# Patient Record
Sex: Male | Born: 1945 | Hispanic: No | Marital: Single | Smoking: Former smoker
Health system: Southern US, Community
[De-identification: ages and names within clinical notes are randomized; demographics above are authoritative.]

## PROBLEM LIST (undated history)

## (undated) DIAGNOSIS — E559 Vitamin D deficiency, unspecified: Secondary | ICD-10-CM

## (undated) DIAGNOSIS — R195 Other fecal abnormalities: Secondary | ICD-10-CM

## (undated) DIAGNOSIS — I10 Essential (primary) hypertension: Secondary | ICD-10-CM

## (undated) DIAGNOSIS — E119 Type 2 diabetes mellitus without complications: Secondary | ICD-10-CM

## (undated) HISTORY — PX: ANKLE SURGERY: SHX546

## (undated) HISTORY — DX: Type 2 diabetes mellitus without complications: E11.9

## (undated) HISTORY — DX: Essential (primary) hypertension: I10

## (undated) HISTORY — DX: Other fecal abnormalities: R19.5

## (undated) HISTORY — DX: Vitamin D deficiency, unspecified: E55.9

---

## 2006-09-29 ENCOUNTER — Ambulatory Visit (HOSPITAL_COMMUNITY): Admission: RE | Admit: 2006-09-29 | Discharge: 2006-09-29 | Payer: Self-pay | Admitting: Family Medicine

## 2013-08-04 DIAGNOSIS — IMO0001 Reserved for inherently not codable concepts without codable children: Secondary | ICD-10-CM | POA: Diagnosis not present

## 2013-09-02 DIAGNOSIS — IMO0001 Reserved for inherently not codable concepts without codable children: Secondary | ICD-10-CM | POA: Diagnosis not present

## 2013-11-09 DIAGNOSIS — IMO0001 Reserved for inherently not codable concepts without codable children: Secondary | ICD-10-CM | POA: Diagnosis not present

## 2013-12-15 DIAGNOSIS — E119 Type 2 diabetes mellitus without complications: Secondary | ICD-10-CM | POA: Diagnosis not present

## 2013-12-15 DIAGNOSIS — Z0000 Encounter for general adult medical examination without abnormal findings: Secondary | ICD-10-CM | POA: Diagnosis not present

## 2013-12-15 DIAGNOSIS — Z23 Encounter for immunization: Secondary | ICD-10-CM | POA: Diagnosis not present

## 2013-12-15 DIAGNOSIS — I10 Essential (primary) hypertension: Secondary | ICD-10-CM | POA: Diagnosis not present

## 2013-12-15 DIAGNOSIS — Z125 Encounter for screening for malignant neoplasm of prostate: Secondary | ICD-10-CM | POA: Diagnosis not present

## 2013-12-15 DIAGNOSIS — Z1211 Encounter for screening for malignant neoplasm of colon: Secondary | ICD-10-CM | POA: Diagnosis not present

## 2014-03-17 DIAGNOSIS — Z23 Encounter for immunization: Secondary | ICD-10-CM | POA: Diagnosis not present

## 2014-03-17 DIAGNOSIS — IMO0001 Reserved for inherently not codable concepts without codable children: Secondary | ICD-10-CM | POA: Diagnosis not present

## 2014-06-01 DIAGNOSIS — Z1211 Encounter for screening for malignant neoplasm of colon: Secondary | ICD-10-CM | POA: Diagnosis not present

## 2014-08-08 DIAGNOSIS — E1165 Type 2 diabetes mellitus with hyperglycemia: Secondary | ICD-10-CM | POA: Diagnosis not present

## 2014-10-19 DIAGNOSIS — E1165 Type 2 diabetes mellitus with hyperglycemia: Secondary | ICD-10-CM | POA: Diagnosis not present

## 2014-10-19 DIAGNOSIS — I10 Essential (primary) hypertension: Secondary | ICD-10-CM | POA: Diagnosis not present

## 2014-12-21 DIAGNOSIS — Z1389 Encounter for screening for other disorder: Secondary | ICD-10-CM | POA: Diagnosis not present

## 2014-12-21 DIAGNOSIS — I10 Essential (primary) hypertension: Secondary | ICD-10-CM | POA: Diagnosis not present

## 2014-12-21 DIAGNOSIS — E1165 Type 2 diabetes mellitus with hyperglycemia: Secondary | ICD-10-CM | POA: Diagnosis not present

## 2015-02-07 DIAGNOSIS — E119 Type 2 diabetes mellitus without complications: Secondary | ICD-10-CM | POA: Diagnosis not present

## 2015-02-07 DIAGNOSIS — I10 Essential (primary) hypertension: Secondary | ICD-10-CM | POA: Diagnosis not present

## 2015-03-20 DIAGNOSIS — Z23 Encounter for immunization: Secondary | ICD-10-CM | POA: Diagnosis not present

## 2015-03-20 DIAGNOSIS — E1165 Type 2 diabetes mellitus with hyperglycemia: Secondary | ICD-10-CM | POA: Diagnosis not present

## 2015-03-20 DIAGNOSIS — Z0000 Encounter for general adult medical examination without abnormal findings: Secondary | ICD-10-CM | POA: Diagnosis not present

## 2015-03-20 DIAGNOSIS — Z136 Encounter for screening for cardiovascular disorders: Secondary | ICD-10-CM | POA: Diagnosis not present

## 2015-03-20 DIAGNOSIS — I10 Essential (primary) hypertension: Secondary | ICD-10-CM | POA: Diagnosis not present

## 2015-03-20 DIAGNOSIS — Z125 Encounter for screening for malignant neoplasm of prostate: Secondary | ICD-10-CM | POA: Diagnosis not present

## 2015-03-20 DIAGNOSIS — Z1389 Encounter for screening for other disorder: Secondary | ICD-10-CM | POA: Diagnosis not present

## 2015-03-21 ENCOUNTER — Other Ambulatory Visit: Payer: Self-pay | Admitting: Family Medicine

## 2015-03-21 DIAGNOSIS — Z136 Encounter for screening for cardiovascular disorders: Secondary | ICD-10-CM

## 2015-04-13 ENCOUNTER — Ambulatory Visit
Admission: RE | Admit: 2015-04-13 | Discharge: 2015-04-13 | Disposition: A | Discharge status: Home / Self Care | Payer: Medicare Other | Source: Ambulatory Visit | Attending: Family Medicine | Admitting: Family Medicine

## 2015-04-13 DIAGNOSIS — Z136 Encounter for screening for cardiovascular disorders: Secondary | ICD-10-CM

## 2015-05-16 DIAGNOSIS — E119 Type 2 diabetes mellitus without complications: Secondary | ICD-10-CM | POA: Diagnosis not present

## 2015-06-27 DIAGNOSIS — E119 Type 2 diabetes mellitus without complications: Secondary | ICD-10-CM | POA: Diagnosis not present

## 2015-06-27 DIAGNOSIS — I10 Essential (primary) hypertension: Secondary | ICD-10-CM | POA: Diagnosis not present

## 2015-09-20 DIAGNOSIS — I10 Essential (primary) hypertension: Secondary | ICD-10-CM | POA: Diagnosis not present

## 2015-09-20 DIAGNOSIS — Z794 Long term (current) use of insulin: Secondary | ICD-10-CM | POA: Diagnosis not present

## 2015-09-20 DIAGNOSIS — E1165 Type 2 diabetes mellitus with hyperglycemia: Secondary | ICD-10-CM | POA: Diagnosis not present

## 2015-09-20 DIAGNOSIS — Z7984 Long term (current) use of oral hypoglycemic drugs: Secondary | ICD-10-CM | POA: Diagnosis not present

## 2015-10-17 DIAGNOSIS — E119 Type 2 diabetes mellitus without complications: Secondary | ICD-10-CM | POA: Diagnosis not present

## 2015-10-17 DIAGNOSIS — I10 Essential (primary) hypertension: Secondary | ICD-10-CM | POA: Diagnosis not present

## 2016-01-01 DIAGNOSIS — E119 Type 2 diabetes mellitus without complications: Secondary | ICD-10-CM | POA: Diagnosis not present

## 2016-01-01 DIAGNOSIS — Z1211 Encounter for screening for malignant neoplasm of colon: Secondary | ICD-10-CM | POA: Diagnosis not present

## 2016-01-16 DIAGNOSIS — E559 Vitamin D deficiency, unspecified: Secondary | ICD-10-CM | POA: Diagnosis not present

## 2016-01-16 DIAGNOSIS — I10 Essential (primary) hypertension: Secondary | ICD-10-CM | POA: Diagnosis not present

## 2016-01-16 DIAGNOSIS — E119 Type 2 diabetes mellitus without complications: Secondary | ICD-10-CM | POA: Diagnosis not present

## 2016-01-16 DIAGNOSIS — Z23 Encounter for immunization: Secondary | ICD-10-CM | POA: Diagnosis not present

## 2016-01-16 DIAGNOSIS — R195 Other fecal abnormalities: Secondary | ICD-10-CM | POA: Diagnosis not present

## 2016-04-19 DIAGNOSIS — I10 Essential (primary) hypertension: Secondary | ICD-10-CM | POA: Diagnosis not present

## 2016-04-19 DIAGNOSIS — L989 Disorder of the skin and subcutaneous tissue, unspecified: Secondary | ICD-10-CM | POA: Diagnosis not present

## 2016-04-19 DIAGNOSIS — E119 Type 2 diabetes mellitus without complications: Secondary | ICD-10-CM | POA: Diagnosis not present

## 2016-04-19 DIAGNOSIS — R195 Other fecal abnormalities: Secondary | ICD-10-CM | POA: Diagnosis not present

## 2016-04-19 DIAGNOSIS — Z23 Encounter for immunization: Secondary | ICD-10-CM | POA: Diagnosis not present

## 2016-08-24 IMAGING — US US AORTA SCREENING (MEDICARE)
1 series · 14 of 25 positions shown · non-contrast
Comparison: No priors.

CLINICAL DATA: Medicare screening exam for abdominal aortic
aneurysm.

EXAM:
ABDOMINAL AORTA SCREENING ULTRASOUND
TECHNIQUE: Ultrasound examination of the abdominal aorta was performed as a
screening evaluation for abdominal aortic aneurysm.

[Series 1: us aorta screening (medicare) · 0.23mm/px · 14 of 27 slices shown]
[im 1/27]
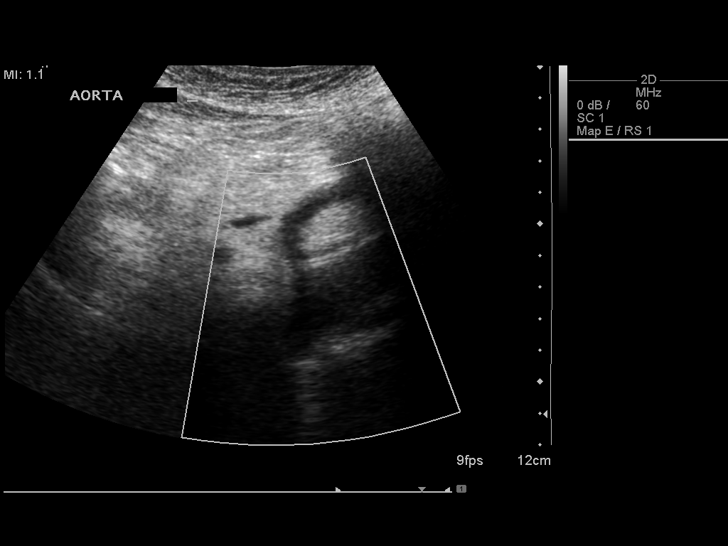
[im 3/27]
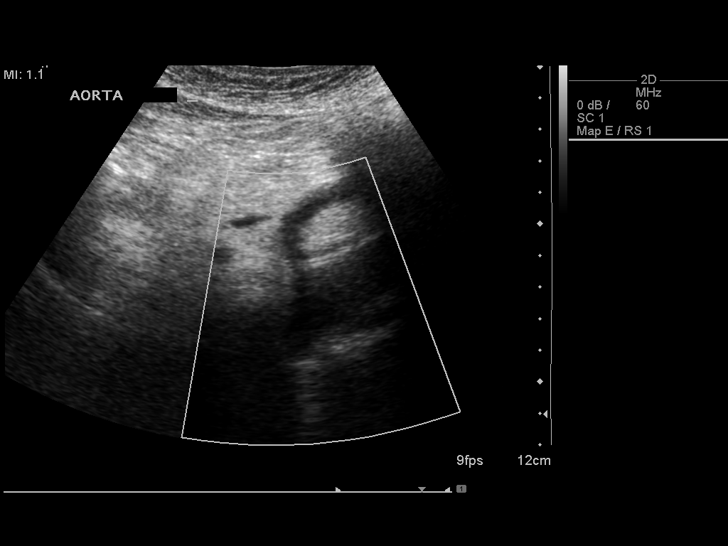
[im 5/27]
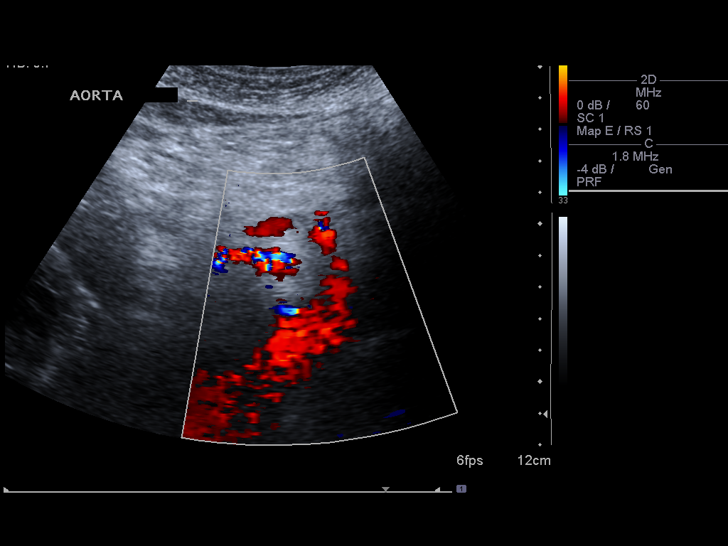
[im 7/27]
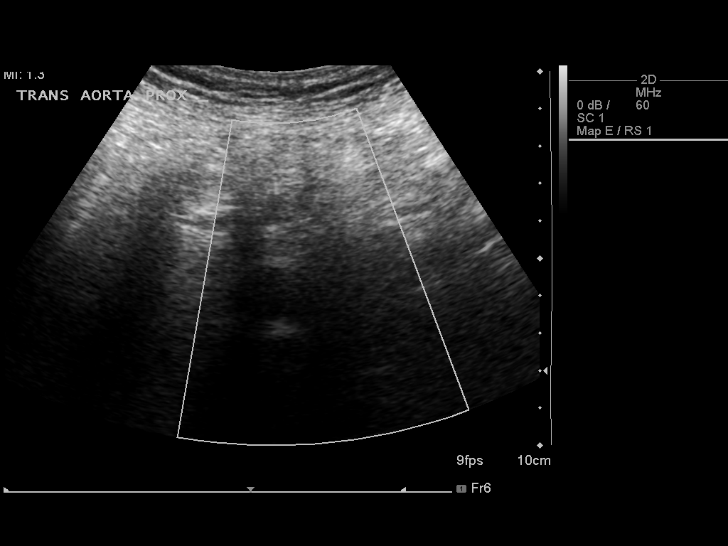
[im 9/27]
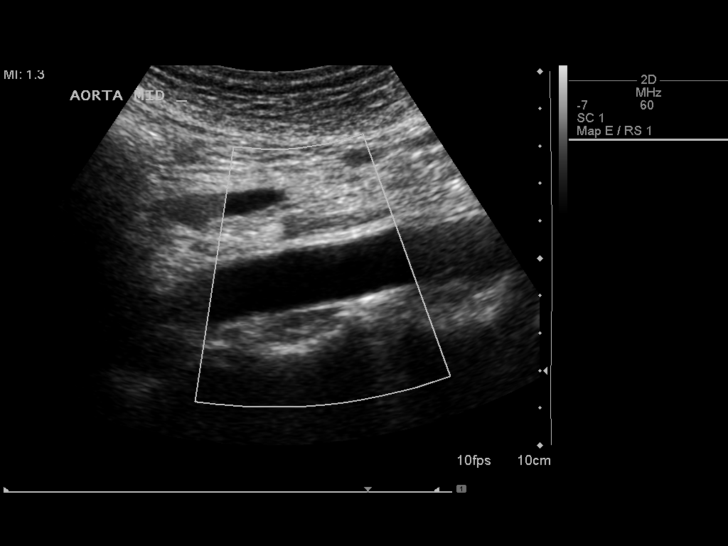
[im 10/27]
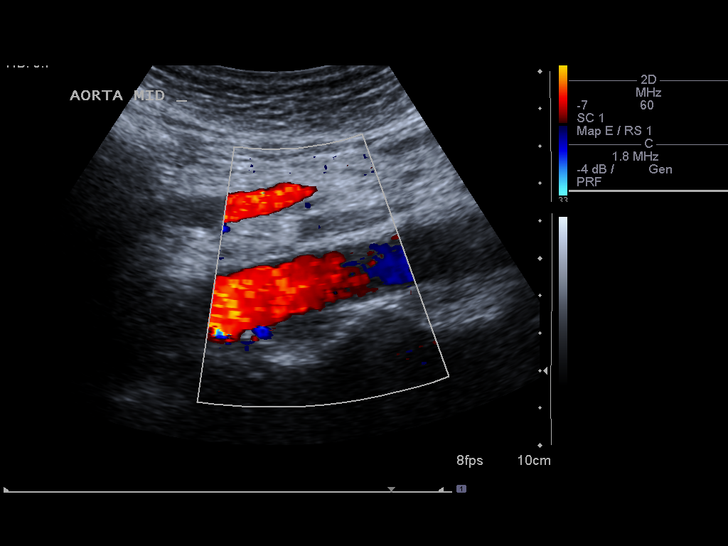
[im 12/27]
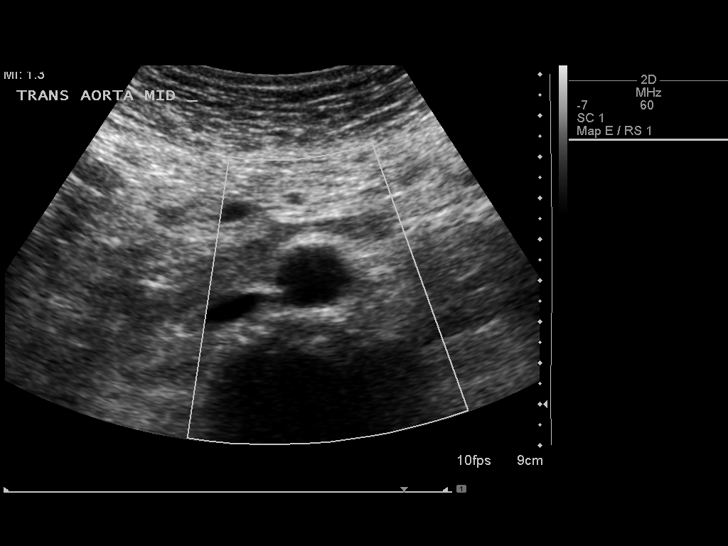
[im 15/27]
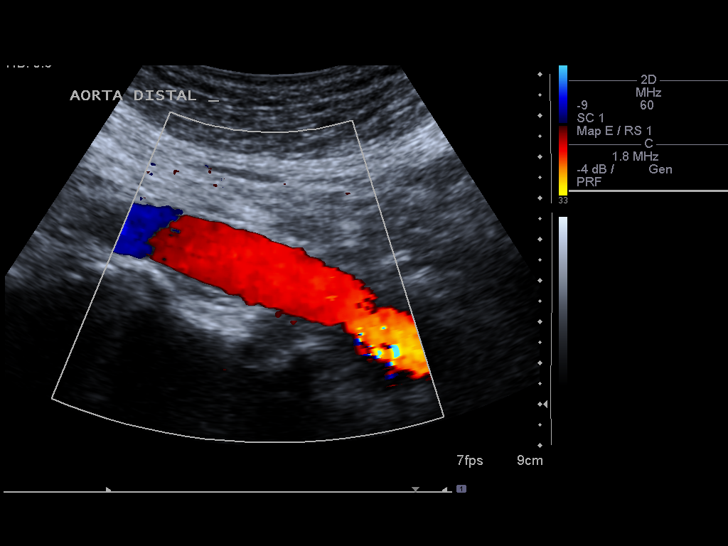
[im 17/27]
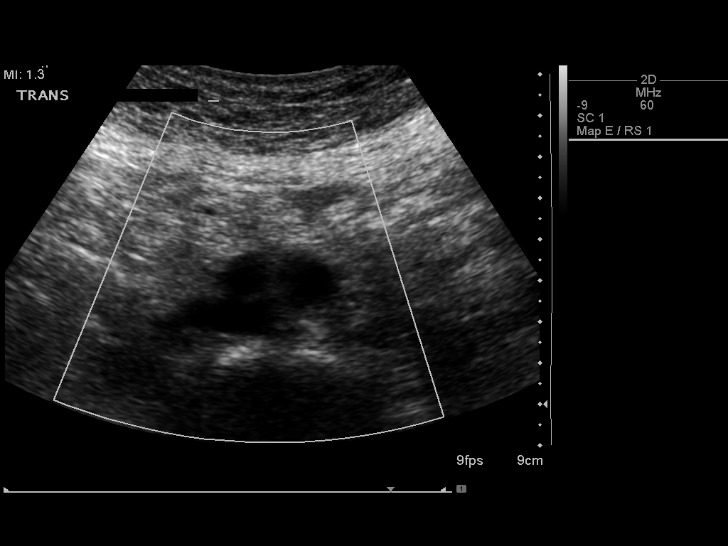
[im 18/27]
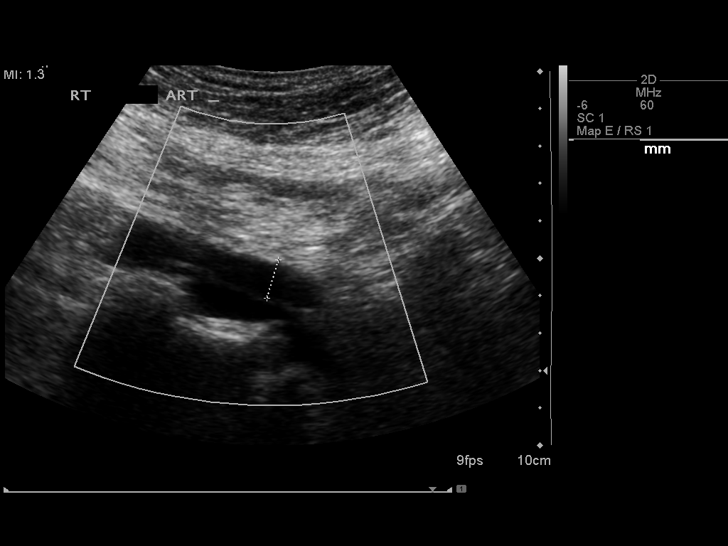
[im 20/27]
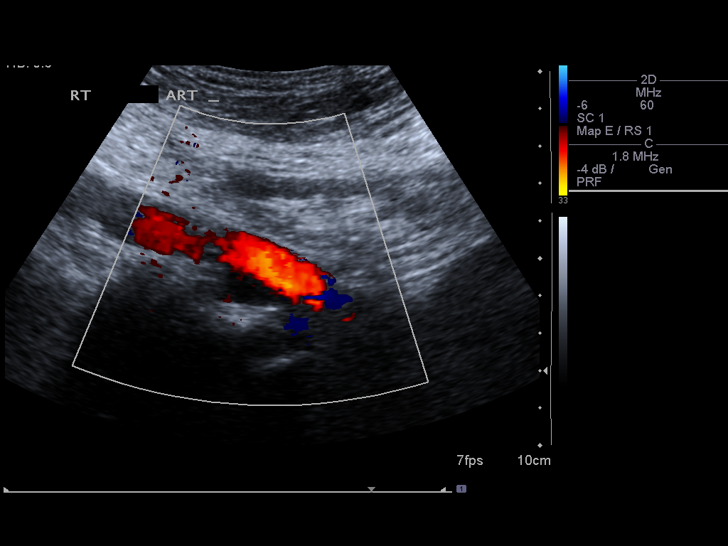
[im 22/27]
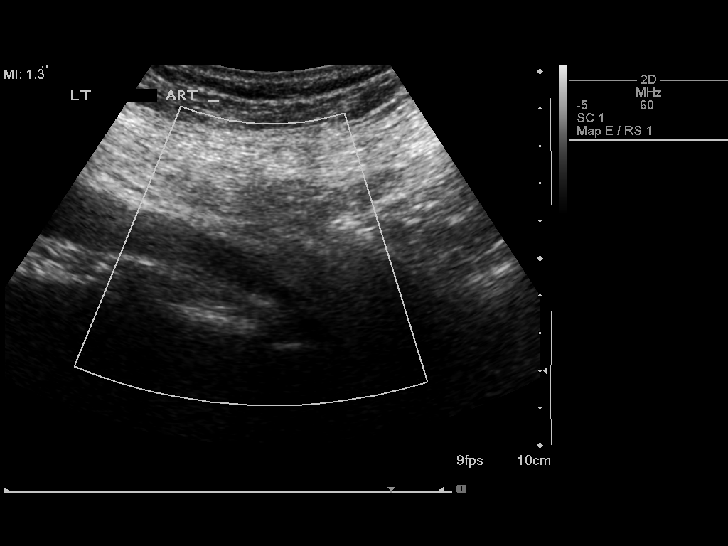
[im 24/27]
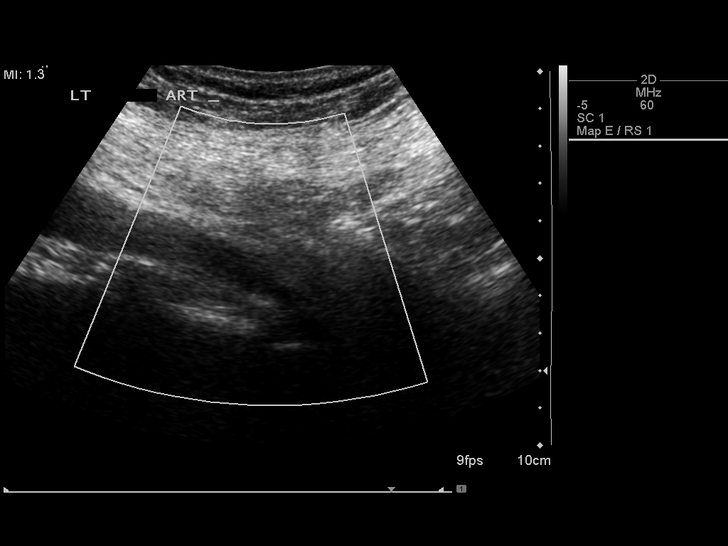
[im 27/27]
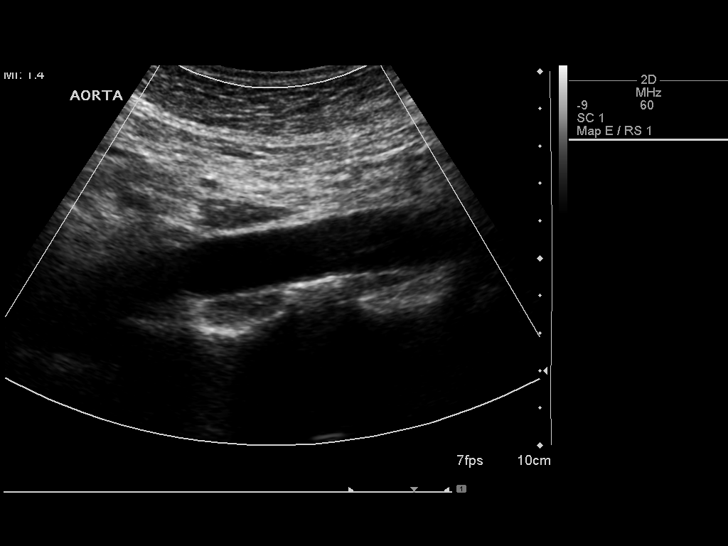

[14 of 25 positions shown; findings below may reference images not displayed]

FINDINGS: Abdominal Aorta

No aneurysm identified.

Maximum Diameter: 2.2 x 1.4 cm proximally
IMPRESSION: No evidence of abdominal aortic aneurysm.

## 2016-11-18 DIAGNOSIS — I10 Essential (primary) hypertension: Secondary | ICD-10-CM | POA: Diagnosis not present

## 2016-11-18 DIAGNOSIS — R195 Other fecal abnormalities: Secondary | ICD-10-CM | POA: Diagnosis not present

## 2016-11-18 DIAGNOSIS — E559 Vitamin D deficiency, unspecified: Secondary | ICD-10-CM | POA: Diagnosis not present

## 2016-11-18 DIAGNOSIS — E119 Type 2 diabetes mellitus without complications: Secondary | ICD-10-CM | POA: Diagnosis not present

## 2016-11-25 ENCOUNTER — Encounter: Payer: Self-pay | Admitting: Gastroenterology

## 2016-12-10 ENCOUNTER — Ambulatory Visit (INDEPENDENT_AMBULATORY_CARE_PROVIDER_SITE_OTHER): Payer: Medicare Other | Admitting: Nurse Practitioner

## 2016-12-10 ENCOUNTER — Encounter: Payer: Self-pay | Admitting: Nurse Practitioner

## 2016-12-10 ENCOUNTER — Other Ambulatory Visit: Payer: Self-pay

## 2016-12-10 DIAGNOSIS — K5900 Constipation, unspecified: Secondary | ICD-10-CM | POA: Diagnosis not present

## 2016-12-10 DIAGNOSIS — K921 Melena: Secondary | ICD-10-CM

## 2016-12-10 DIAGNOSIS — R195 Other fecal abnormalities: Secondary | ICD-10-CM | POA: Insufficient documentation

## 2016-12-10 DIAGNOSIS — K649 Unspecified hemorrhoids: Secondary | ICD-10-CM | POA: Diagnosis not present

## 2016-12-10 MED ORDER — PEG 3350-KCL-NA BICARB-NACL 420 G PO SOLR: 4000.0000 mL | ORAL | 0 refills | Status: DC

## 2016-12-10 MED ORDER — NA SULFATE-K SULFATE-MG SULF 17.5-3.13-1.6 GM/177ML PO SOLN: 1.0000 | ORAL | 0 refills | Status: DC

## 2016-12-10 NOTE — Assessment & Plan Note (Signed)
Mid to end of 2017 the patient had heme-positive stool. It was noted he is never had a colonoscopy. He does note intermittent constipation and thinks he has hemorrhoids but they are generally asymptomatic. His brother did have some sort of abdominal cancer but he is not sure what kind specifically. At this point he is due for colonoscopy even without heme positive stool. This only increases the need to have it done. We'll proceed with colonoscopy at this time.  Proceed with colonoscopy with Dr. Oneida Alar in the near future. The risks, benefits, and alternatives have been discussed in detail with the patient. They state understanding and desire to proceed.   The patient is not on any anticoagulants, anxiolytics, chronic pain medications, or antidepressants. He denies alcohol and drug use. Conscious sedation should be adequate for his procedure.  We will have him hold half his diabetes medicines night before and taken on the morning of his procedure.

## 2016-12-10 NOTE — Progress Notes (Signed)
cc'ed to PCP °

## 2016-12-10 NOTE — Patient Instructions (Signed)
1. We will schedule your procedure for you. 2. Further recommendations to be made based on the results of your procedure. 3. Return for follow-up based on the recommendations made after your colonoscopy. 4. Call us if he needed to be seen for any worsening constipation or hemorrhoid symptoms.

## 2016-12-10 NOTE — Assessment & Plan Note (Signed)
The patient thinks he has hemorrhoids although they are asymptomatic and a general sense. He does have heme positive stool and hemorrhoid bleeding in the setting of constipation remains high on the differential list. However, given his symptoms and the fact that he has never had a colonoscopy at age 71 years old we will proceed with colonoscopy as per below. Return for follow-up based on postprocedure recommendations are as needed.

## 2016-12-10 NOTE — Progress Notes (Addendum)
Primary Care Physician:  System, Provider Not In Primary Gastroenterologist:  Dr. Oneida Alar  Chief Complaint  Patient presents with  . Colonoscopy    never had tcs, brother has cancer (unsure where)  . Constipation  . Blood In Stools    + occult blood  . Hemorrhoids    HPI:   Brendan Wheeler is a 71 y.o. male who presents On referral from primary care for the [place] for first-ever colonoscopy, constipation, heme positive stools, and hemorrhoids. Primary care notes and labs reviewed. Noted heme positive stool 01/02/2016, CBC completed 11/18/2016 found normal hemoglobin at 14.7. He has never had a colonoscopy.  Today he states he's doing ok. Has constipation which just started about 3-4 months ago with straining and is off and on. Denies abdominal pain, current hematochezia, melena. Did have a single episode of hematochezia with a bowel movement around 06/2016 when straining. Thinks he has hemorrhoids but they do not bother him (no itching or burning). When he is constipated he will drink milk which helps. Denies N/V, unintentional weight loss, fevers, chills. Denies chest pain, dyspnea, dizziness, lightheadedness, syncope, near syncope. Denies any other upper or lower GI symptoms.  Past Medical History:  Diagnosis Date  . DM (diabetes mellitus) ([place])   . Heme + stool   . Hypertension   . Vitamin D deficiency     Past Surgical History:  Procedure Laterality Date  . ANKLE SURGERY     s/p fx with pin placement    Current Outpatient Prescriptions  Medication Sig Dispense Refill  . glipiZIDE (GLUCOTROL) 10 MG tablet Take 10 mg by mouth 2 (two) times daily.    Marland Kitchen lisinopril (PRINIVIL,ZESTRIL) 40 MG tablet Take 40 mg by mouth daily.    . metFORMIN (GLUCOPHAGE) 500 MG tablet Take 1,000 mg by mouth 2 (two) times daily with a meal.    . pioglitazone (ACTOS) 30 MG tablet Take 30 mg by mouth daily.    . polyethylene glycol-electrolytes (TRILYTE) 420 g solution  Take 4,000 mLs by mouth as directed. 4000 mL 0   No current facility-administered medications for this visit.     Allergies as of 12/10/2016  . (No Known Allergies)    Family History  Problem Relation Age of Onset  . Cancer Brother        Unknown what kind, but somewhere in his stomach    Social History   Social History  . Marital status: Single    Spouse name: N/A  . Number of children: N/A  . Years of education: N/A   Occupational History  . Not on file.   Social History Main Topics  . Smoking status: Former Smoker    Quit date: 07/22/1982  . Smokeless tobacco: Never Used  . Alcohol use No  . Drug use: No  . Sexual activity: Not on file   Other Topics Concern  . Not on file   Social History Narrative  . No narrative on file    Review of Systems: Complete ROS negative except as per HPI.    Physical Exam: BP (!) 155/89   Pulse 89   Temp 98.2 F (36.8 C) (Oral)   Ht 5\' 10"  (1.778 m)   Wt 211 lb (95.7 kg)   BMI 30.28 kg/m  General:   Obese male. Alert and oriented. Pleasant and cooperative. Well-nourished and well-developed.  Eyes:  Without icterus, sclera clear and conjunctiva pink.  Ears:  Normal auditory acuity. Cardiovascular:  S1, S2 present  without murmurs appreciated. Extremities without clubbing or edema. Respiratory:  Clear to auscultation bilaterally. No wheezes, rales, or rhonchi. No distress.  Gastrointestinal:  +BS, rounded but soft, non-tender and non-distended. No HSM noted. No guarding or rebound. No masses appreciated.  Rectal:  Deferred  Musculoskalatal:  Symmetrical without gross deformities. Neurologic:  Alert and oriented x4;  grossly normal neurologically. Psych:  Alert and cooperative. Normal mood and affect. Heme/Lymph/Immune: No excessive bruising noted.    12/11/2016 5:09 PM   Disclaimer: This note was dictated with voice recognition software. Similar sounding words can inadvertently be transcribed and may not be corrected  upon review.

## 2016-12-10 NOTE — Assessment & Plan Note (Signed)
Noted intermittent constipation and typically he will drink dairy products which help. He seems happy with this. If he has worsening constipation he can follow up with [place] and we can offer further recommendations. Return for follow-up based on postprocedure recommendations/as needed.

## 2016-12-11 ENCOUNTER — Encounter: Payer: Self-pay | Admitting: Nurse Practitioner

## 2017-01-31 ENCOUNTER — Other Ambulatory Visit: Payer: Self-pay

## 2017-01-31 MED ORDER — PEG 3350-KCL-NA BICARB-NACL 420 G PO SOLR: 4000.0000 mL | ORAL | 0 refills | Status: DC

## 2017-02-05 ENCOUNTER — Encounter (HOSPITAL_COMMUNITY)
Admission: RE | Disposition: A | Discharge status: Home / Self Care | Payer: Self-pay | Source: Ambulatory Visit | Attending: Gastroenterology

## 2017-02-05 ENCOUNTER — Ambulatory Visit (HOSPITAL_COMMUNITY)
Admission: RE | Admit: 2017-02-05 | Discharge: 2017-02-05 | Disposition: A | Discharge status: Home / Self Care | Payer: Medicare Other | Source: Ambulatory Visit | Attending: Gastroenterology | Admitting: Gastroenterology

## 2017-02-05 ENCOUNTER — Encounter (HOSPITAL_COMMUNITY): Payer: Self-pay | Admitting: *Deleted

## 2017-02-05 DIAGNOSIS — Z809 Family history of malignant neoplasm, unspecified: Secondary | ICD-10-CM | POA: Diagnosis not present

## 2017-02-05 DIAGNOSIS — K649 Unspecified hemorrhoids: Secondary | ICD-10-CM

## 2017-02-05 DIAGNOSIS — D123 Benign neoplasm of transverse colon: Secondary | ICD-10-CM | POA: Insufficient documentation

## 2017-02-05 DIAGNOSIS — D122 Benign neoplasm of ascending colon: Secondary | ICD-10-CM | POA: Diagnosis not present

## 2017-02-05 DIAGNOSIS — I10 Essential (primary) hypertension: Secondary | ICD-10-CM | POA: Insufficient documentation

## 2017-02-05 DIAGNOSIS — K921 Melena: Secondary | ICD-10-CM | POA: Diagnosis not present

## 2017-02-05 DIAGNOSIS — E119 Type 2 diabetes mellitus without complications: Secondary | ICD-10-CM | POA: Diagnosis not present

## 2017-02-05 DIAGNOSIS — K648 Other hemorrhoids: Secondary | ICD-10-CM | POA: Diagnosis not present

## 2017-02-05 DIAGNOSIS — Z87891 Personal history of nicotine dependence: Secondary | ICD-10-CM | POA: Diagnosis not present

## 2017-02-05 DIAGNOSIS — E559 Vitamin D deficiency, unspecified: Secondary | ICD-10-CM | POA: Insufficient documentation

## 2017-02-05 DIAGNOSIS — Z7984 Long term (current) use of oral hypoglycemic drugs: Secondary | ICD-10-CM | POA: Diagnosis not present

## 2017-02-05 HISTORY — PX: COLONOSCOPY: SHX5424

## 2017-02-05 LAB — GLUCOSE, CAPILLARY: Glucose-Capillary: 189 mg/dL — ABNORMAL HIGH (ref 65–99)

## 2017-02-05 SURGERY — COLONOSCOPY
Anesthesia: Moderate Sedation

## 2017-02-05 MED ORDER — MEPERIDINE HCL 100 MG/ML IJ SOLN
INTRAMUSCULAR | Status: AC
  Filled 2017-02-05: qty 2

## 2017-02-05 MED ORDER — MIDAZOLAM HCL 5 MG/5ML IJ SOLN
INTRAMUSCULAR | Status: AC
  Filled 2017-02-05: qty 10

## 2017-02-05 MED ORDER — STERILE WATER FOR IRRIGATION IR SOLN
Status: DC | PRN
  Administered 2017-02-05: 2.5 mL

## 2017-02-05 MED ORDER — MEPERIDINE HCL 100 MG/ML IJ SOLN
INTRAMUSCULAR | Status: DC | PRN
  Administered 2017-02-05 (×2): 25 mg via INTRAVENOUS

## 2017-02-05 MED ORDER — MIDAZOLAM HCL 5 MG/5ML IJ SOLN
INTRAMUSCULAR | Status: DC | PRN
  Administered 2017-02-05: 1 mg via INTRAVENOUS
  Administered 2017-02-05: 2 mg via INTRAVENOUS

## 2017-02-05 MED ORDER — SODIUM CHLORIDE 0.9 % IV SOLN
INTRAVENOUS | Status: DC
  Administered 2017-02-05: 12:00:00 via INTRAVENOUS

## 2017-02-05 NOTE — Discharge Instructions (Signed)
You had 5 polyps removed. You have MODERATE internal AND LARGE EXTERNAL hemorrhoids.    NO MRI UNTIL METAL CLIPS PLACED IN THE COLON HAVE PASSED IN YOUR STOOL.  CONTINUE YOUR WEIGHT LOSS EFFORTS. LOSE TEN POUNDS.  WHILE I DO NOT WANT TO ALARM YOU, YOUR BODY MASS INDEX(BMI) IS OVER 30 WHICH MEANS YOU ARE OBESE. OBESITY IS ASSOCIATED WITH AN INCREASE RISK FOR ALL CANCERS, INCLUDING ESOPHAGEAL AND COLON CANCER. A WEIGHT OF 200 LBS WILL GET YOUR BMI LESS THAN 30.  DRINK WATER TO KEEP YOUR URINE LIGHT YELLOW.  FOLLOW A HIGH FIBER DIET. AVOID ITEMS THAT CAUSE BLOATING & GAS. SEE INFO BELOW.  USE PREPARATION H FOUR TIMES  A DAY IF NEEDED TO RELIEVE RECTAL PAIN/PRESSURE/BLEEDING.  YOUR BIOPSY RESULTS WILL BE AVAILABLE IN MY CHART AFTER JUL 21 AND MY OFFICE WILL CONTACT YOU IN 10-14 DAYS WITH YOUR RESULTS.   Next colonoscopy in 1-3 years.    Colonoscopy Care After Read the instructions outlined below and refer to this sheet in the next week. These discharge instructions provide you with general information on caring for yourself after you leave the hospital. While your treatment has been planned according to the most current medical practices available, unavoidable complications occasionally occur. If you have any problems or questions after discharge, call DR. Sydelle Sherfield, [phone].  ACTIVITY  You may resume your regular activity, but move at a slower pace for the next 24 hours.   Take frequent rest periods for the next 24 hours.   Walking will help get rid of the air and reduce the bloated feeling in your belly (abdomen).   No driving for 24 hours (because of the medicine (anesthesia) used during the test).   You may shower.   Do not sign any important legal documents or operate any machinery for 24 hours (because of the anesthesia used during the test).    NUTRITION  Drink plenty of fluids.   You may resume your normal diet as instructed by your doctor.   Begin with a light meal  and progress to your normal diet. Heavy or fried foods are harder to digest and may make you feel sick to your stomach (nauseated).   Avoid alcoholic beverages for 24 hours or as instructed.    MEDICATIONS  You may resume your normal medications.   WHAT YOU CAN EXPECT TODAY  Some feelings of bloating in the abdomen.   Passage of more gas than usual.   Spotting of blood in your stool or on the toilet paper  .  IF YOU HAD POLYPS REMOVED DURING THE COLONOSCOPY:  Eat a soft diet IF YOU HAVE NAUSEA, BLOATING, ABDOMINAL PAIN, OR VOMITING.    FINDING OUT THE RESULTS OF YOUR TEST Not all test results are available during your visit. DR. Oneida Alar WILL CALL YOU WITHIN 14 DAYS OF YOUR PROCEDUE WITH YOUR RESULTS. Do not assume everything is normal if you have not heard from DR. Silas Sedam, CALL HER OFFICE AT [phone].  SEEK IMMEDIATE MEDICAL ATTENTION AND CALL THE OFFICE: [phone] IF:  You have more than a spotting of blood in your stool.   Your belly is swollen (abdominal distention).   You are nauseated or vomiting.   You have a temperature over 101F.   You have abdominal pain or discomfort that is severe or gets worse throughout the day.   High-Fiber Diet A high-fiber diet changes your normal diet to include more whole grains, legumes, fruits, and vegetables. Changes in the diet involve replacing  refined carbohydrates with unrefined foods. The calorie level of the diet is essentially unchanged. The Dietary Reference Intake (recommended amount) for adult males is 38 grams per day. For adult females, it is 25 grams per day. Pregnant and lactating women should consume 28 grams of fiber per day. Fiber is the intact part of a plant that is not broken down during digestion. Functional fiber is fiber that has been isolated from the plant to provide a beneficial effect in the body. PURPOSE  Increase stool bulk.   Ease and regulate bowel movements.   Lower cholesterol.   REDUCE  RISK OF COLON CANCER  INDICATIONS THAT YOU NEED MORE FIBER  Constipation and hemorrhoids.   Uncomplicated diverticulosis (intestine condition) and irritable bowel syndrome.   Weight management.   As a protective measure against hardening of the arteries (atherosclerosis), diabetes, and cancer.   GUIDELINES FOR INCREASING FIBER IN THE DIET  Start adding fiber to the diet slowly. A gradual increase of about 5 more grams (2 slices of whole-wheat bread, 2 servings of most fruits or vegetables, or 1 bowl of high-fiber cereal) per day is best. Too rapid an increase in fiber may result in constipation, flatulence, and bloating.   Drink enough water and fluids to keep your urine clear or pale yellow. Water, juice, or caffeine-free drinks are recommended. Not drinking enough fluid may cause constipation.   Eat a variety of high-fiber foods rather than one type of fiber.   Try to increase your intake of fiber through using high-fiber foods rather than fiber pills or supplements that contain small amounts of fiber.   The goal is to change the types of food eaten. Do not supplement your present diet with high-fiber foods, but replace foods in your present diet.   INCLUDE A VARIETY OF FIBER SOURCES  Replace refined and processed grains with whole grains, canned fruits with fresh fruits, and incorporate other fiber sources. White rice, white breads, and most bakery goods contain little or no fiber.   Brown whole-grain rice, buckwheat oats, and many fruits and vegetables are all good sources of fiber. These include: broccoli, Brussels sprouts, cabbage, cauliflower, beets, sweet potatoes, white potatoes (skin on), carrots, tomatoes, eggplant, squash, berries, fresh fruits, and dried fruits.   Cereals appear to be the richest source of fiber. Cereal fiber is found in whole grains and bran. Bran is the fiber-rich outer coat of cereal grain, which is largely removed in refining. In whole-grain cereals,  the bran remains. In breakfast cereals, the largest amount of fiber is found in those with "bran" in their names. The fiber content is sometimes indicated on the label.   You may need to include additional fruits and vegetables each day.   In baking, for 1 cup white flour, you may use the following substitutions:   1 cup whole-wheat flour minus 2 tablespoons.   1/2 cup white flour plus 1/2 cup whole-wheat flour.   Polyps, Colon  A polyp is extra tissue that grows inside your body. Colon polyps grow in the large intestine. The large intestine, also called the colon, is part of your digestive system. It is a long, hollow tube at the end of your digestive tract where your body makes and stores stool.Most polyps are not dangerous. They are benign. This means they are not cancerous. But over time, some types of polyps can turn into cancer. Polyps that are smaller than a pea are usually not harmful. But larger polyps could someday become or may already  be cancerous. To be safe, doctors remove all polyps and test them.   PREVENTION There is not one sure way to prevent polyps. You might be able to lower your risk of getting them if you:  Eat more fruits and vegetables and less fatty food.   Do not smoke.   Avoid alcohol.   Exercise every day.   Lose weight if you are overweight.   Eating more calcium and folate can also lower your risk of getting polyps. Some foods that are rich in calcium are milk, cheese, and broccoli. Some foods that are rich in folate are chickpeas, kidney beans, and spinach.   Hemorrhoids Hemorrhoids are dilated (enlarged) veins around the rectum. Sometimes clots will form in the veins. This makes them swollen and painful. These are called thrombosed hemorrhoids. Causes of hemorrhoids include:  Constipation.   Straining to have a bowel movement.   HEAVY LIFTING  HOME CARE INSTRUCTIONS  Eat a well balanced diet and drink 6 to 8 glasses of water every day to avoid  constipation. You may also use a bulk laxative.   Avoid straining to have bowel movements.   Keep anal area dry and clean.   Do not use a donut shaped pillow or sit on the toilet for long periods. This increases blood pooling and pain.   Move your bowels when your body has the urge; this will require less straining and will decrease pain and pressure.

## 2017-02-05 NOTE — Op Note (Addendum)
[facility] Patient Name: Brendan Wheeler Procedure Date: 02/05/2017 12:07 PM MRN: [id number] Date of Birth: 08/21/1945 Attending MD: Barney Drain , MD CSN: [id number] Age: 71 Admit Type: Outpatient Procedure:                Colonoscopy WITH COLD FORCEPS & SNARE/SNARE CAUTERY                            POLYPECTOMY Indications:              Hematochezia Providers:                Barney Drain, MD, Lurline Del, RN, Charlyne Petrin                            RN, RN Referring MD:              Medicines:                Meperidine 50 mg IV, Midazolam 3 mg IV Complications:            No immediate complications. Estimated Blood Loss:     Estimated blood loss was minimal. Procedure:                Pre-Anesthesia Assessment:                           - Prior to the procedure, a History and Physical                            was performed, and patient medications and                            allergies were reviewed. The patient's tolerance of                            previous anesthesia was also reviewed. The risks                            and benefits of the procedure and the sedation                            options and risks were discussed with the patient.                            All questions were answered, and informed consent                            was obtained. Prior Anticoagulants: The patient has                            taken no previous anticoagulant or antiplatelet                            agents. ASA Grade Assessment: II - A patient with  mild systemic disease. After reviewing the risks                            and benefits, the patient was deemed in                            satisfactory condition to undergo the procedure.                            After obtaining informed consent, the colonoscope                            was passed under direct vision. Throughout the                            procedure, the patient's blood  pressure, pulse, and                            oxygen saturations were monitored continuously. The                            EC-[id number] ([id number]) scope was introduced through                            the anus and advanced to the 5 cm into the ileum.                            The colonoscopy was somewhat difficult due to a                            tortuous colon. Successful completion of the                            procedure was aided by COLOWRAP. The patient                            tolerated the procedure well. The quality of the                            bowel preparation was good. The terminal ileum,                            ileocecal valve, appendiceal orifice, and rectum                            were photographed. Scope In: 1:00:24 PM Scope Out: 1:36:54 PM Scope Withdrawal Time: 0 hours 31 minutes 19 seconds  Total Procedure Duration: 0 hours 36 minutes 30 seconds  Findings:      The terminal ileum appeared normal.      Three sessile polyps were found in the transverse colon(2) and ascending       colon. The polyps were 5 to 10 mm in size. These polyps were removed       with a hot snare. Resection and  retrieval were complete. To prevent       bleeding after the polypectomy, two hemostatic clips were successfully       placed (MR conditional). There was no bleeding at the end of the       procedure.      A 5 mm polyp was found in the ascending colon. The polyp was sessile.       The polyp was removed with a cold snare. Resection and retrieval were       complete.      A 4 mm polyp was found in the distal transverse colon. The polyp was       sessile. The polyp was removed with a cold biopsy forceps. Resection and       retrieval were complete.      Non-bleeding internal hemorrhoids were found during retroflexion. The       hemorrhoids were moderate.      External hemorrhoids were found during retroflexion. The hemorrhoids       were large. Impression:                - INTERMITTENT RECTAL BLEEDING DUE TO INTERNAL                            HEMORRHOIDS                           - Three 5 to 10 mm polyps in the transverse colon                            and in the ascending colon, removed with a hot                            snare. Resected and retrieved. TWO Clips (MR                            conditional) were placed.                           - One 5 mm polyp in the ascending colon, removed                            with a cold snare. Resected and retrieved.                           - One 4 mm polyp in the distal transverse colon,                            removed with a cold biopsy forceps. Resected and                            retrieved. Moderate Sedation:      Moderate (conscious) sedation was administered by the endoscopy nurse       and supervised by the endoscopist. The following parameters were       monitored: oxygen saturation, heart rate, blood pressure, and response       to care. Total physician intraservice time was 54 minutes. Recommendation:           -  Await pathology results. NO MRI UNTIL CLIPS PASS                            IN STOOL.                           - Repeat colonoscopy 1-3 YEARS for surveillance.                           - High fiber diet.                           - Continue present medications.                           - Patient has a contact number available for                            emergencies. The signs and symptoms of potential                            delayed complications were discussed with the                            patient. Return to normal activities tomorrow.                            Written discharge instructions were provided to the                            patient. Procedure Code(s):        --- Professional ---                           [phone], Colonoscopy, flexible; with removal of                            tumor(s), polyp(s), or other lesion(s) by snare                             technique                           45380, 59, Colonoscopy, flexible; with biopsy,                            single or multiple                           99152, Moderate sedation services provided by the                            same physician or other qualified health care                            professional performing the diagnostic or  therapeutic service that the sedation supports,                            requiring the presence of an independent trained                            observer to assist in the monitoring of the                            patient's level of consciousness and physiological                            status; initial 15 minutes of intraservice time,                            patient age 56 years or older                           [phone], Moderate sedation services; each additional                            15 minutes intraservice time                           99153, Moderate sedation services; each additional                            15 minutes intraservice time                           99153, Moderate sedation services; each additional                            15 minutes intraservice time Diagnosis Code(s):        --- Professional ---                           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 All rights reserved. The codes documented in this report are preliminary and upon coder review may  be revised to meet current compliance requirements. Barney Drain, MD Barney Drain, MD 02/05/2017 1:56:43 PM This report has been signed electronically. Number of Addenda: 0

## 2017-02-05 NOTE — Progress Notes (Signed)
REVIEWED-NO ADDITIONAL RECOMMENDATIONS. 

## 2017-02-05 NOTE — H&P (Signed)
Primary Care Physician:  System, Provider Not In Primary Gastroenterologist:  Dr. Oneida Alar  Pre-Procedure History & Physical: HPI:  Brendan Wheeler is a 71 y.o. male here for HEME POS STOOLS.  Past Medical History:  Diagnosis Date  . DM (diabetes mellitus) ([place])   . Heme + stool   . Hypertension   . Vitamin D deficiency     Past Surgical History:  Procedure Laterality Date  . ANKLE SURGERY     s/p fx with pin placement    Prior to Admission medications   Medication Sig Start Date End Date Taking? Authorizing Provider  glipiZIDE (GLUCOTROL) 10 MG tablet Take 10 mg by mouth 2 (two) times daily.   Yes [provider]  lisinopril (PRINIVIL,ZESTRIL) 40 MG tablet Take 40 mg by mouth daily.   Yes [provider]  metFORMIN (GLUCOPHAGE) 500 MG tablet Take 1,000 mg by mouth 2 (two) times daily with a meal.   Yes [provider]  pioglitazone (ACTOS) 30 MG tablet Take 30 mg by mouth daily.   Yes [provider]  polyethylene glycol-electrolytes (TRILYTE) 420 g solution Take 4,000 mLs by mouth as directed. 01/31/17  Yes Danie Binder, MD    Allergies as of 12/10/2016  . (No Known Allergies)    Family History  Problem Relation Age of Onset  . Cancer Brother        Unknown what kind, but somewhere in his stomach    Social History   Social History  . Marital status: Single    Spouse name: N/A  . Number of children: N/A  . Years of education: N/A   Occupational History  . Not on file.   Social History Main Topics  . Smoking status: Former Smoker    Quit date: 07/22/1982  . Smokeless tobacco: Never Used  . Alcohol use No  . Drug use: No  . Sexual activity: Not on file   Other Topics Concern  . Not on file   Social History Narrative  . No narrative on file    Review of Systems: See HPI, otherwise negative ROS   Physical Exam: BP 128/67   Pulse 73   Temp 98.3 F (36.8 C) (Oral)   Resp 14   Ht 5\' 10"  (1.778 m)   Wt 207 lb (93.9  kg)   SpO2 100%   BMI 29.70 kg/m  General:   Alert,  pleasant and cooperative in NAD Head:  Normocephalic and atraumatic. Neck:  Supple; Lungs:  Clear throughout to auscultation.    Heart:  Regular rate and rhythm. Abdomen:  Soft, nontender and nondistended. Normal bowel sounds, without guarding, and without rebound.   Neurologic:  Alert and  oriented x4;  grossly normal neurologically.  Impression/Plan:     HEME POS STOOLS  PLAN:  1.TCS TODAY.Marland Kitchen DISCUSSED PROCEDURE, BENEFITS, & RISKS: < 1% chance of medication reaction, bleeding, perforation, or rupture of spleen/liver.

## 2017-02-07 ENCOUNTER — Encounter (HOSPITAL_COMMUNITY): Payer: Self-pay | Admitting: Gastroenterology

## 2017-02-10 ENCOUNTER — Telehealth: Payer: Self-pay | Admitting: Gastroenterology

## 2017-02-10 NOTE — Telephone Encounter (Signed)
Please call pt. HE had FIVE simple adenomas removed.   DRINK WATER TO KEEP YOUR URINE LIGHT YELLOW.  FOLLOW A HIGH FIBER DIET. AVOID ITEMS THAT CAUSE BLOATING & GAS.   USE PREPARATION H FOUR TIMES  A DAY IF NEEDED TO RELIEVE RECTAL PAIN/PRESSURE/BLEEDING.  Next colonoscopy in 3 years.

## 2017-02-11 NOTE — Telephone Encounter (Signed)
PT is aware.

## 2017-02-11 NOTE — Telephone Encounter (Signed)
Reminder in epic °

## 2017-03-21 DIAGNOSIS — Z135 Encounter for screening for eye and ear disorders: Secondary | ICD-10-CM | POA: Diagnosis not present

## 2017-03-21 DIAGNOSIS — I10 Essential (primary) hypertension: Secondary | ICD-10-CM | POA: Diagnosis not present

## 2017-03-21 DIAGNOSIS — E119 Type 2 diabetes mellitus without complications: Secondary | ICD-10-CM | POA: Diagnosis not present

## 2017-03-21 DIAGNOSIS — K5900 Constipation, unspecified: Secondary | ICD-10-CM | POA: Diagnosis not present

## 2017-03-21 DIAGNOSIS — D126 Benign neoplasm of colon, unspecified: Secondary | ICD-10-CM | POA: Diagnosis not present

## 2017-08-26 DIAGNOSIS — E119 Type 2 diabetes mellitus without complications: Secondary | ICD-10-CM | POA: Diagnosis not present

## 2017-08-26 DIAGNOSIS — I10 Essential (primary) hypertension: Secondary | ICD-10-CM | POA: Diagnosis not present

## 2017-08-26 DIAGNOSIS — D126 Benign neoplasm of colon, unspecified: Secondary | ICD-10-CM | POA: Diagnosis not present

## 2017-08-26 DIAGNOSIS — E669 Obesity, unspecified: Secondary | ICD-10-CM | POA: Diagnosis not present

## 2017-10-17 DIAGNOSIS — Z23 Encounter for immunization: Secondary | ICD-10-CM | POA: Diagnosis not present

## 2017-10-17 DIAGNOSIS — I10 Essential (primary) hypertension: Secondary | ICD-10-CM | POA: Diagnosis not present

## 2017-10-17 DIAGNOSIS — E119 Type 2 diabetes mellitus without complications: Secondary | ICD-10-CM | POA: Diagnosis not present

## 2017-11-24 DIAGNOSIS — I10 Essential (primary) hypertension: Secondary | ICD-10-CM | POA: Diagnosis not present

## 2017-11-24 DIAGNOSIS — Z1331 Encounter for screening for depression: Secondary | ICD-10-CM | POA: Diagnosis not present

## 2017-11-24 DIAGNOSIS — E119 Type 2 diabetes mellitus without complications: Secondary | ICD-10-CM | POA: Diagnosis not present

## 2017-11-26 DIAGNOSIS — E119 Type 2 diabetes mellitus without complications: Secondary | ICD-10-CM | POA: Diagnosis not present

## 2018-01-06 DIAGNOSIS — I10 Essential (primary) hypertension: Secondary | ICD-10-CM | POA: Diagnosis not present

## 2018-01-06 DIAGNOSIS — H34831 Tributary (branch) retinal vein occlusion, right eye, with macular edema: Secondary | ICD-10-CM | POA: Diagnosis not present

## 2018-01-06 DIAGNOSIS — E119 Type 2 diabetes mellitus without complications: Secondary | ICD-10-CM | POA: Diagnosis not present

## 2018-01-09 DIAGNOSIS — H348392 Tributary (branch) retinal vein occlusion, unspecified eye, stable: Secondary | ICD-10-CM | POA: Diagnosis not present

## 2018-01-09 DIAGNOSIS — I10 Essential (primary) hypertension: Secondary | ICD-10-CM | POA: Diagnosis not present

## 2018-01-12 DIAGNOSIS — E113392 Type 2 diabetes mellitus with moderate nonproliferative diabetic retinopathy without macular edema, left eye: Secondary | ICD-10-CM | POA: Diagnosis not present

## 2018-01-12 DIAGNOSIS — H34831 Tributary (branch) retinal vein occlusion, right eye, with macular edema: Secondary | ICD-10-CM | POA: Diagnosis not present

## 2018-01-12 DIAGNOSIS — E113491 Type 2 diabetes mellitus with severe nonproliferative diabetic retinopathy without macular edema, right eye: Secondary | ICD-10-CM | POA: Diagnosis not present

## 2018-01-13 DIAGNOSIS — E119 Type 2 diabetes mellitus without complications: Secondary | ICD-10-CM | POA: Diagnosis not present

## 2018-01-13 DIAGNOSIS — I10 Essential (primary) hypertension: Secondary | ICD-10-CM | POA: Diagnosis not present

## 2018-01-20 DIAGNOSIS — Z9189 Other specified personal risk factors, not elsewhere classified: Secondary | ICD-10-CM | POA: Diagnosis not present

## 2018-01-20 DIAGNOSIS — H34831 Tributary (branch) retinal vein occlusion, right eye, with macular edema: Secondary | ICD-10-CM | POA: Diagnosis not present

## 2018-01-20 DIAGNOSIS — E1165 Type 2 diabetes mellitus with hyperglycemia: Secondary | ICD-10-CM | POA: Diagnosis not present

## 2018-01-20 DIAGNOSIS — E113392 Type 2 diabetes mellitus with moderate nonproliferative diabetic retinopathy without macular edema, left eye: Secondary | ICD-10-CM | POA: Diagnosis not present

## 2018-01-20 DIAGNOSIS — H348392 Tributary (branch) retinal vein occlusion, unspecified eye, stable: Secondary | ICD-10-CM | POA: Diagnosis not present

## 2018-01-20 DIAGNOSIS — I10 Essential (primary) hypertension: Secondary | ICD-10-CM | POA: Diagnosis not present

## 2018-01-20 DIAGNOSIS — Z7189 Other specified counseling: Secondary | ICD-10-CM | POA: Diagnosis not present

## 2018-01-20 DIAGNOSIS — E113491 Type 2 diabetes mellitus with severe nonproliferative diabetic retinopathy without macular edema, right eye: Secondary | ICD-10-CM | POA: Diagnosis not present

## 2018-02-11 DIAGNOSIS — E119 Type 2 diabetes mellitus without complications: Secondary | ICD-10-CM | POA: Diagnosis not present

## 2018-04-17 DIAGNOSIS — I10 Essential (primary) hypertension: Secondary | ICD-10-CM | POA: Diagnosis not present

## 2018-04-17 DIAGNOSIS — Z23 Encounter for immunization: Secondary | ICD-10-CM | POA: Diagnosis not present

## 2018-04-17 DIAGNOSIS — E119 Type 2 diabetes mellitus without complications: Secondary | ICD-10-CM | POA: Diagnosis not present

## 2018-04-17 DIAGNOSIS — H34831 Tributary (branch) retinal vein occlusion, right eye, with macular edema: Secondary | ICD-10-CM | POA: Diagnosis not present

## 2018-04-17 DIAGNOSIS — Z9189 Other specified personal risk factors, not elsewhere classified: Secondary | ICD-10-CM | POA: Diagnosis not present

## 2018-04-17 DIAGNOSIS — R635 Abnormal weight gain: Secondary | ICD-10-CM | POA: Diagnosis not present

## 2018-05-21 DIAGNOSIS — E113392 Type 2 diabetes mellitus with moderate nonproliferative diabetic retinopathy without macular edema, left eye: Secondary | ICD-10-CM | POA: Diagnosis not present

## 2018-05-21 DIAGNOSIS — H34831 Tributary (branch) retinal vein occlusion, right eye, with macular edema: Secondary | ICD-10-CM | POA: Diagnosis not present

## 2018-05-21 DIAGNOSIS — H25813 Combined forms of age-related cataract, bilateral: Secondary | ICD-10-CM | POA: Diagnosis not present

## 2018-05-21 DIAGNOSIS — E119 Type 2 diabetes mellitus without complications: Secondary | ICD-10-CM | POA: Diagnosis not present

## 2018-05-21 DIAGNOSIS — E113491 Type 2 diabetes mellitus with severe nonproliferative diabetic retinopathy without macular edema, right eye: Secondary | ICD-10-CM | POA: Diagnosis not present

## 2018-06-30 DIAGNOSIS — E119 Type 2 diabetes mellitus without complications: Secondary | ICD-10-CM | POA: Diagnosis not present

## 2018-06-30 DIAGNOSIS — Z9189 Other specified personal risk factors, not elsewhere classified: Secondary | ICD-10-CM | POA: Diagnosis not present

## 2018-06-30 DIAGNOSIS — Z1331 Encounter for screening for depression: Secondary | ICD-10-CM | POA: Diagnosis not present

## 2018-06-30 DIAGNOSIS — I10 Essential (primary) hypertension: Secondary | ICD-10-CM | POA: Diagnosis not present

## 2018-06-30 DIAGNOSIS — H34831 Tributary (branch) retinal vein occlusion, right eye, with macular edema: Secondary | ICD-10-CM | POA: Diagnosis not present

## 2018-07-07 DIAGNOSIS — H34831 Tributary (branch) retinal vein occlusion, right eye, with macular edema: Secondary | ICD-10-CM | POA: Diagnosis not present

## 2018-07-07 DIAGNOSIS — H25813 Combined forms of age-related cataract, bilateral: Secondary | ICD-10-CM | POA: Diagnosis not present

## 2018-07-07 DIAGNOSIS — E113491 Type 2 diabetes mellitus with severe nonproliferative diabetic retinopathy without macular edema, right eye: Secondary | ICD-10-CM | POA: Diagnosis not present

## 2018-07-07 DIAGNOSIS — E113392 Type 2 diabetes mellitus with moderate nonproliferative diabetic retinopathy without macular edema, left eye: Secondary | ICD-10-CM | POA: Diagnosis not present

## 2018-07-20 DIAGNOSIS — H34831 Tributary (branch) retinal vein occlusion, right eye, with macular edema: Secondary | ICD-10-CM | POA: Diagnosis not present

## 2019-12-14 ENCOUNTER — Encounter: Payer: Self-pay | Admitting: Gastroenterology

## 2023-09-12 ENCOUNTER — Encounter (HOSPITAL_COMMUNITY): Payer: Self-pay | Admitting: Emergency Medicine

## 2023-09-12 ENCOUNTER — Emergency Department (HOSPITAL_COMMUNITY)
Admission: EM | Admit: 2023-09-12 | Discharge: 2023-09-12 | Disposition: A | Discharge status: Home / Self Care | Payer: Medicare PPO | Attending: Emergency Medicine | Admitting: Emergency Medicine

## 2023-09-12 ENCOUNTER — Other Ambulatory Visit: Payer: Self-pay

## 2023-09-12 DIAGNOSIS — Z794 Long term (current) use of insulin: Secondary | ICD-10-CM | POA: Insufficient documentation

## 2023-09-12 DIAGNOSIS — Z760 Encounter for issue of repeat prescription: Secondary | ICD-10-CM | POA: Insufficient documentation

## 2023-09-12 DIAGNOSIS — E1165 Type 2 diabetes mellitus with hyperglycemia: Secondary | ICD-10-CM | POA: Diagnosis not present

## 2023-09-12 DIAGNOSIS — Z7984 Long term (current) use of oral hypoglycemic drugs: Secondary | ICD-10-CM | POA: Diagnosis not present

## 2023-09-12 DIAGNOSIS — R739 Hyperglycemia, unspecified: Secondary | ICD-10-CM

## 2023-09-12 LAB — URINALYSIS, ROUTINE W REFLEX MICROSCOPIC
Bilirubin Urine: NEGATIVE
Glucose, UA: >=500 mg/dL — AB
Ketones, ur: 5 mg/dL — AB
Leukocytes,Ua: NEGATIVE
Nitrite: NEGATIVE
Protein, ur: >=300 mg/dL — AB
Specific Gravity, Urine: 1.027 (ref 1.005–1.030)
pH: 5 (ref 5.0–8.0)

## 2023-09-12 LAB — BASIC METABOLIC PANEL
Anion gap: 12 (ref 5–15)
BUN: 29 mg/dL — ABNORMAL HIGH (ref 8–23)
CO2: 20 mmol/L — ABNORMAL LOW (ref 22–32)
Calcium: 8.6 mg/dL — ABNORMAL LOW (ref 8.9–10.3)
Chloride: 96 mmol/L — ABNORMAL LOW (ref 98–111)
Creatinine, Ser: 1.68 mg/dL — ABNORMAL HIGH (ref 0.61–1.24)
GFR, Estimated: 42 mL/min — ABNORMAL LOW (ref >60)
Glucose, Bld: 542 mg/dL (ref 70–99)
Potassium: 4.7 mmol/L (ref 3.5–5.1)
Sodium: 128 mmol/L — ABNORMAL LOW (ref 135–145)

## 2023-09-12 LAB — CBC WITH DIFFERENTIAL/PLATELET
Abs Immature Granulocytes: 0.1 10*3/uL — ABNORMAL HIGH (ref 0.00–0.07)
Basophils Absolute: 0 10*3/uL (ref 0.0–0.1)
Basophils Relative: 0 %
Eosinophils Absolute: 0 10*3/uL (ref 0.0–0.5)
Eosinophils Relative: 0 %
HCT: 43.4 % (ref 39.0–52.0)
Hemoglobin: 14 g/dL (ref 13.0–17.0)
Immature Granulocytes: 1 %
Lymphocytes Relative: 8 %
Lymphs Abs: 0.6 10*3/uL — ABNORMAL LOW (ref 0.7–4.0)
MCH: 27.5 pg (ref 26.0–34.0)
MCHC: 32.3 g/dL (ref 30.0–36.0)
MCV: 85.3 fL (ref 80.0–100.0)
Monocytes Absolute: 0.2 10*3/uL (ref 0.1–1.0)
Monocytes Relative: 3 %
Neutro Abs: 6.9 10*3/uL (ref 1.7–7.7)
Neutrophils Relative %: 88 %
Platelets: 283 10*3/uL (ref 150–400)
RBC: 5.09 MIL/uL (ref 4.22–5.81)
RDW: 15.7 % — ABNORMAL HIGH (ref 11.5–15.5)
WBC: 7.9 10*3/uL (ref 4.0–10.5)
nRBC: 0 % (ref 0.0–0.2)

## 2023-09-12 LAB — CBG MONITORING, ED
Glucose-Capillary: 476 mg/dL — ABNORMAL HIGH (ref 70–99)
Glucose-Capillary: 430 mg/dL — ABNORMAL HIGH (ref 70–99)
Glucose-Capillary: 485 mg/dL — ABNORMAL HIGH (ref 70–99)
Glucose-Capillary: 383 mg/dL — ABNORMAL HIGH (ref 70–99)
Glucose-Capillary: 332 mg/dL — ABNORMAL HIGH (ref 70–99)

## 2023-09-12 LAB — BLOOD GAS, VENOUS
Acid-Base Excess: 0 mmol/L (ref 0.0–2.0)
Bicarbonate: 25.4 mmol/L (ref 20.0–28.0)
O2 Saturation: 57 %
Patient temperature: 37
pCO2, Ven: 43 mm[Hg] — ABNORMAL LOW (ref 44–60)
pH, Ven: 7.38 (ref 7.25–7.43)
pO2, Ven: 34 mm[Hg] (ref 32–45)

## 2023-09-12 LAB — OSMOLALITY: Osmolality: 315 mosm/kg — ABNORMAL HIGH (ref 275–295)

## 2023-09-12 LAB — BETA-HYDROXYBUTYRIC ACID: Beta-Hydroxybutyric Acid: 2.62 mmol/L — ABNORMAL HIGH (ref 0.05–0.27)

## 2023-09-12 MED ORDER — PIOGLITAZONE HCL 30 MG PO TABS: 30.0000 mg | ORAL_TABLET | Freq: Every day | ORAL | 0 refills | Status: AC

## 2023-09-12 MED ORDER — SODIUM CHLORIDE 0.9 % IV BOLUS
1000.0000 mL | Freq: Once | INTRAVENOUS | Status: AC
  Administered 2023-09-12: 1000 mL via INTRAVENOUS

## 2023-09-12 MED ORDER — GLIPIZIDE 10 MG PO TABS: 10.0000 mg | ORAL_TABLET | Freq: Two times a day (BID) | ORAL | 0 refills | Status: AC

## 2023-09-12 MED ORDER — INSULIN REGULAR(HUMAN) IN NACL 100-0.9 UT/100ML-% IV SOLN
INTRAVENOUS | Status: DC
  Administered 2023-09-12: 13 [IU]/h via INTRAVENOUS
  Filled 2023-09-12: qty 100

## 2023-09-12 MED ORDER — METFORMIN HCL 500 MG PO TABS: 1000.0000 mg | ORAL_TABLET | Freq: Two times a day (BID) | ORAL | 0 refills | Status: AC

## 2023-09-12 NOTE — ED Triage Notes (Signed)
 Patient presents due need for refill of diabetes medication. He believes he may be hyerglycemic.

## 2023-09-12 NOTE — ED Provider Notes (Signed)
 I provided a substantive portion of the care of this patient.  I personally made/approved the management plan for this patient and take responsibility for the patient management.      Patient here requesting refill of his diabetes medication.  Patient has hyperglycemia here without evidence of DKA.  Given IV hydration here as well as insulin.  Will recheck blood sugars and likely discharge home   Lorre Nick, MD 09/12/23 2043

## 2023-09-12 NOTE — Discharge Instructions (Signed)
 Follow-up with your primary care doctor at your scheduled appointment next week.  I have sent medication refills to your pharmacy please take as prescribed.  If you have any new or worsening symptoms please return to emergency room.  Please continue to check blood sugar at home. Please have kidney function recheck at this time.  To emergency room for new or worsening symptoms.

## 2023-09-12 NOTE — ED Provider Notes (Signed)
 [place] EMERGENCY DEPARTMENT AT [facility] Provider Note   CSN: [id number] Arrival date & time: 09/12/23  1550     History  Chief Complaint  Patient presents with   Medication Refill    Brendan Wheeler is a 78 y.o. male medical history of type 2 diabetes presenting to emergency room with hyperglycemia.  Patient reports has been out of his medication approximately 2 weeks.  Reports he typically takes insulin and oral diabetic medication at home.  Reports he was seen at new primary care office today and had blood sugar over 600 and was sent here.  Reports 2 days of generalized weakness, fatigue, excessive thirst and frequent urination. Denies chest pain, shortness of breath, NVD, abdominal pain.    Medication Refill      Home Medications Prior to Admission medications   Medication Sig Start Date End Date Taking? Authorizing Provider  glipiZIDE (GLUCOTROL) 10 MG tablet Take 10 mg by mouth 2 (two) times daily.    [provider]  lisinopril (PRINIVIL,ZESTRIL) 40 MG tablet Take 40 mg by mouth daily.    [provider]  metFORMIN (GLUCOPHAGE) 500 MG tablet Take 1,000 mg by mouth 2 (two) times daily with a meal.    [provider]  pioglitazone (ACTOS) 30 MG tablet Take 30 mg by mouth daily.    [provider]      Allergies    Patient has no known allergies.    Review of Systems   Review of Systems  Endocrine: Positive for polydipsia and polyuria.    Physical Exam Updated Vital Signs BP (!) 165/67   Pulse 93   Temp 97.8 F (36.6 C) (Oral)   Resp 18   SpO2 100%  Physical Exam Vitals and nursing note reviewed.  Constitutional:      General: He is not in acute distress.    Appearance: He is not ill-appearing or toxic-appearing.  HENT:     Head: Normocephalic and atraumatic.  Eyes:     General: No scleral icterus.    Conjunctiva/sclera: Conjunctivae normal.  Cardiovascular:     Rate and Rhythm: Normal rate and regular  rhythm.     Pulses: Normal pulses.     Heart sounds: Normal heart sounds.  Pulmonary:     Effort: Pulmonary effort is normal. No respiratory distress.     Breath sounds: Normal breath sounds.  Abdominal:     General: Abdomen is flat. Bowel sounds are normal.     Palpations: Abdomen is soft.     Tenderness: There is no abdominal tenderness.  Skin:    General: Skin is warm and dry.     Findings: No lesion.  Neurological:     General: No focal deficit present.     Mental Status: He is alert and oriented to person, place, and time. Mental status is at baseline.     ED Results / Procedures / Treatments   Labs (all labs ordered are listed, but only abnormal results are displayed) Labs Reviewed  BASIC METABOLIC PANEL - Abnormal; Notable for the following components:      Result Value   Sodium 128 (*)    Chloride 96 (*)    CO2 20 (*)    Glucose, Bld 542 (*)    BUN 29 (*)    Creatinine, Ser 1.68 (*)    Calcium 8.6 (*)    GFR, Estimated 42 (*)    All other components within normal limits  CBC WITH DIFFERENTIAL/PLATELET - Abnormal;  Notable for the following components:   RDW 15.7 (*)    Lymphs Abs 0.6 (*)    Abs Immature Granulocytes 0.10 (*)    All other components within normal limits  URINALYSIS, ROUTINE W REFLEX MICROSCOPIC - Abnormal; Notable for the following components:   APPearance HAZY (*)    Glucose, UA >=500 (*)    Hgb urine dipstick SMALL (*)    Ketones, ur 5 (*)    Protein, ur >=300 (*)    Bacteria, UA RARE (*)    All other components within normal limits  BETA-HYDROXYBUTYRIC ACID - Abnormal; Notable for the following components:   Beta-Hydroxybutyric Acid 2.62 (*)    All other components within normal limits  BLOOD GAS, VENOUS - Abnormal; Notable for the following components:   pCO2, Ven 43 (*)    All other components within normal limits  CBG MONITORING, ED - Abnormal; Notable for the following components:   Glucose-Capillary 476 (*)    All other components  within normal limits  CBG MONITORING, ED - Abnormal; Notable for the following components:   Glucose-Capillary 485 (*)    All other components within normal limits  CBG MONITORING, ED - Abnormal; Notable for the following components:   Glucose-Capillary 430 (*)    All other components within normal limits  CBG MONITORING, ED - Abnormal; Notable for the following components:   Glucose-Capillary 383 (*)    All other components within normal limits  CBG MONITORING, ED - Abnormal; Notable for the following components:   Glucose-Capillary 332 (*)    All other components within normal limits  OSMOLALITY    EKG None  Radiology No results found.  Procedures Procedures    Medications Ordered in ED Medications - No data to display  ED Course/ Medical Decision Making/ A&P                                 Medical Decision Making Amount and/or Complexity of Data Reviewed Labs: ordered.  Risk Prescription drug management.   This patient presents to the ED for concern of hyperglycemia, this involves an extensive number of treatment options, and is a complaint that carries with it a high risk of complications and morbidity.  The differential diagnosis includes hyperglycemia, DKA, HHS, medication noncompliance   Co morbidities that complicate the patient evaluation  DM2    Lab Tests:  I personally interpreted labs.  The pertinent results include:   CBC without leukocytosis and no anemia.  BMP without elevation in anion gap.  BUN and creatinine 29 and 1.68 unclear baseline but suspect this is chronic.  Glucose is 542.   Cardiac Monitoring: / EKG:  The patient was maintained on a cardiac monitor.    Problem List / ED Course / Critical interventions / Medication management  On initial evaluation of patient he is alert oriented and well-appearing.  He is hemodynamically stable.  Blood sugar 450.  Considering he has been out of his medication for 2 weeks, will evaluate with  further labs to rule out DKA versus HHS.  He denies any abdominal pain or associated symptoms.  Do not feel imaging is necessary at this time. Today patient's hemodynamically stable and well-appearing.  He is tolerating oral intake and will pick up his medications. labs do not show DKA.  Patients glycemia improved prior to discharge.  Discussed all labs including having labs rechecked.  Patient has follow-up appointment in 1 week  with his primary care doctor.  I have sent his medication refills as requested. I ordered medication including Ns, insulin  Reevaluation of the patient after these medicines showed that the patient improved I have reviewed the patients home medicines and have made adjustments as needed   Plan  F/u w/ PCP in 2-3d to ensure resolution of sx.  Patient was given return precautions. Patient stable for discharge at this time.  Patient educated on sx/dx and verbalized understanding of plan. Return to ER w/ new or worsening sx.          Final Clinical Impression(s) / ED Diagnoses Final diagnoses:  Hyperglycemia    Rx / DC Orders ED Discharge Orders     None         Raford Pitcher Evalee Jefferson 09/12/23 2317    Lorre Nick, MD 09/16/23 [phone]

## 2023-10-24 ENCOUNTER — Emergency Department (HOSPITAL_COMMUNITY)

## 2023-10-24 ENCOUNTER — Emergency Department (HOSPITAL_COMMUNITY)
Admission: EM | Admit: 2023-10-24 | Discharge: 2023-10-24 | Disposition: A | Discharge status: Home / Self Care | Attending: Emergency Medicine | Admitting: Emergency Medicine

## 2023-10-24 ENCOUNTER — Encounter (HOSPITAL_COMMUNITY): Payer: Self-pay

## 2023-10-24 ENCOUNTER — Other Ambulatory Visit: Payer: Self-pay

## 2023-10-24 DIAGNOSIS — Z79899 Other long term (current) drug therapy: Secondary | ICD-10-CM | POA: Diagnosis not present

## 2023-10-24 DIAGNOSIS — I10 Essential (primary) hypertension: Secondary | ICD-10-CM | POA: Insufficient documentation

## 2023-10-24 DIAGNOSIS — W01198A Fall on same level from slipping, tripping and stumbling with subsequent striking against other object, initial encounter: Secondary | ICD-10-CM | POA: Insufficient documentation

## 2023-10-24 DIAGNOSIS — S0990XA Unspecified injury of head, initial encounter: Secondary | ICD-10-CM | POA: Diagnosis present

## 2023-10-24 DIAGNOSIS — R202 Paresthesia of skin: Secondary | ICD-10-CM | POA: Insufficient documentation

## 2023-10-24 DIAGNOSIS — S0081XA Abrasion of other part of head, initial encounter: Secondary | ICD-10-CM | POA: Insufficient documentation

## 2023-10-24 DIAGNOSIS — E119 Type 2 diabetes mellitus without complications: Secondary | ICD-10-CM | POA: Insufficient documentation

## 2023-10-24 DIAGNOSIS — W19XXXA Unspecified fall, initial encounter: Secondary | ICD-10-CM

## 2023-10-24 DIAGNOSIS — Z7984 Long term (current) use of oral hypoglycemic drugs: Secondary | ICD-10-CM | POA: Insufficient documentation

## 2023-10-24 LAB — URINALYSIS, MICROSCOPIC (REFLEX)

## 2023-10-24 LAB — COMPREHENSIVE METABOLIC PANEL WITH GFR
ALT: 17 U/L (ref 0–44)
AST: 26 U/L (ref 15–41)
Albumin: 2.8 g/dL — ABNORMAL LOW (ref 3.5–5.0)
Alkaline Phosphatase: 56 U/L (ref 38–126)
Anion gap: 8 (ref 5–15)
BUN: 38 mg/dL — ABNORMAL HIGH (ref 8–23)
CO2: 21 mmol/L — ABNORMAL LOW (ref 22–32)
Calcium: 8.4 mg/dL — ABNORMAL LOW (ref 8.9–10.3)
Chloride: 107 mmol/L (ref 98–111)
Creatinine, Ser: 1.34 mg/dL — ABNORMAL HIGH (ref 0.61–1.24)
GFR, Estimated: 55 mL/min — ABNORMAL LOW (ref >60)
Glucose, Bld: 76 mg/dL (ref 70–99)
Potassium: 3.9 mmol/L (ref 3.5–5.1)
Sodium: 136 mmol/L (ref 135–145)
Total Bilirubin: 0.7 mg/dL (ref 0.0–1.2)
Total Protein: 6.2 g/dL — ABNORMAL LOW (ref 6.5–8.1)

## 2023-10-24 LAB — URINALYSIS, ROUTINE W REFLEX MICROSCOPIC
Bilirubin Urine: NEGATIVE
Glucose, UA: 250 mg/dL — AB
Ketones, ur: NEGATIVE mg/dL
Nitrite: NEGATIVE
Protein, ur: >300 mg/dL — AB
Specific Gravity, Urine: 1.025 (ref 1.005–1.030)
pH: 5.5 (ref 5.0–8.0)

## 2023-10-24 LAB — CBC WITH DIFFERENTIAL/PLATELET
Abs Immature Granulocytes: 0.1 10*3/uL — ABNORMAL HIGH (ref 0.00–0.07)
Basophils Absolute: 0 10*3/uL (ref 0.0–0.1)
Basophils Relative: 0 %
Eosinophils Absolute: 0 10*3/uL (ref 0.0–0.5)
Eosinophils Relative: 1 %
HCT: 32.5 % — ABNORMAL LOW (ref 39.0–52.0)
Hemoglobin: 11.1 g/dL — ABNORMAL LOW (ref 13.0–17.0)
Immature Granulocytes: 1 %
Lymphocytes Relative: 7 %
Lymphs Abs: 0.6 10*3/uL — ABNORMAL LOW (ref 0.7–4.0)
MCH: 29.3 pg (ref 26.0–34.0)
MCHC: 34.2 g/dL (ref 30.0–36.0)
MCV: 85.8 fL (ref 80.0–100.0)
Monocytes Absolute: 0.5 10*3/uL (ref 0.1–1.0)
Monocytes Relative: 6 %
Neutro Abs: 7.3 10*3/uL (ref 1.7–7.7)
Neutrophils Relative %: 85 %
Platelets: 236 10*3/uL (ref 150–400)
RBC: 3.79 MIL/uL — ABNORMAL LOW (ref 4.22–5.81)
RDW: 14.7 % (ref 11.5–15.5)
WBC: 8.7 10*3/uL (ref 4.0–10.5)
nRBC: 0 % (ref 0.0–0.2)

## 2023-10-24 LAB — RAPID URINE DRUG SCREEN, HOSP PERFORMED
Amphetamines: NOT DETECTED
Barbiturates: NOT DETECTED
Benzodiazepines: NOT DETECTED
Cocaine: NOT DETECTED
Opiates: NOT DETECTED
Tetrahydrocannabinol: NOT DETECTED

## 2023-10-24 LAB — CBG MONITORING, ED: Glucose-Capillary: 78 mg/dL (ref 70–99)

## 2023-10-24 LAB — AMMONIA: Ammonia: 36 umol/L — ABNORMAL HIGH (ref 9–35)

## 2023-10-24 LAB — ETHANOL: Alcohol, Ethyl (B): <10 mg/dL (ref <10)

## 2023-10-24 NOTE — Discharge Instructions (Signed)
 As discussed, your workup today was overall reassuring.  CT imaging of your head did show evidence of nasal bone fractures but otherwise, no brain bleed or other abnormality from the fall.  Recommend follow-up with primary care for reassessment of your symptoms.  Please do not hesitate to return if the worrisome signs and symptoms we discussed to become apparent.

## 2023-10-24 NOTE — ED Provider Notes (Signed)
 [facility] AT [facility] Provider Note   CSN: [id number] Arrival date & time: 10/24/23  4098     History  Chief Complaint  Patient presents with   Kalab Camps is a 78 y.o. male.   Fall   78 year old male presents emergency department after a fall.   Says that he was reaching for a tree branch earlier today when the tree branch was not there and he lost his balance falling.  Reports hitting his forehead on the ground.  Denies LOC, blood thinner use.  Denies any chest pain, shortness of breath, abdominal pain, pain in upper or lower extremities.  Patient also reports tingling type sensation in his feet.  States this been present for the past several months and remains unchanged.  Denies any visual disturbance, slurred speech, facial droop, weakness deficits in upper lower extremities.  Denies any new medication changes.  Denies any substance use.  Past medical history significant for diabetes mellitus, hypertension  Home Medications Prior to Admission medications   Medication Sig Start Date End Date Taking? Authorizing Provider  glipiZIDE (GLUCOTROL) 10 MG tablet Take 1 tablet (10 mg total) by mouth 2 (two) times daily. 09/12/23   Barrett, Horald Chestnut, PA-C  lisinopril (PRINIVIL,ZESTRIL) 40 MG tablet Take 40 mg by mouth daily.    [provider]  metFORMIN (GLUCOPHAGE) 500 MG tablet Take 2 tablets (1,000 mg total) by mouth 2 (two) times daily with a meal. 09/12/23   Barrett, Horald Chestnut, PA-C  pioglitazone (ACTOS) 30 MG tablet Take 1 tablet (30 mg total) by mouth daily. 09/12/23   Barrett, Horald Chestnut, PA-C      Allergies    Patient has no known allergies.    Review of Systems   Review of Systems  All other systems reviewed and are negative.   Physical Exam Updated Vital Signs BP (!) 175/79   Pulse 85   Temp 98.3 F (36.8 C) (Oral)   Resp (!) 22   Ht 5\' 10"  (1.778 m)   Wt 90.7 kg   SpO2 100%   BMI 28.70 kg/m  Physical Exam Vitals  and nursing note reviewed.  Constitutional:      General: He is not in acute distress.    Appearance: He is well-developed.  HENT:     Head: Normocephalic.     Comments: Abrasion left forehead. Eyes:     Conjunctiva/sclera: Conjunctivae normal.  Cardiovascular:     Rate and Rhythm: Normal rate and regular rhythm.  Pulmonary:     Effort: Pulmonary effort is normal. No respiratory distress.     Breath sounds: Normal breath sounds. No wheezing, rhonchi or rales.  Abdominal:     Palpations: Abdomen is soft.     Tenderness: There is no abdominal tenderness. There is no guarding.  Musculoskeletal:        General: No swelling.     Cervical back: Neck supple.     Comments: No midline tenderness cervical, thoracic, lumbar spine without step-off or deformity.  No overlying tenderness bilateral upper or lower extremities.  No chest wall tenderness.  Skin:    General: Skin is warm and dry.     Capillary Refill: Capillary refill takes less than 2 seconds.  Neurological:     Mental Status: He is alert.     Comments: Alert and oriented to self, place, time and event.   Speech is fluent, clear without dysarthria or dysphasia.   Strength symmetric in upper/lower  extremities   Sensation intact in upper/lower extremities besides reported decrease sensation on the soles of bilateral feet.  Normal gait.  CN I not tested  CN II not tested CN III, IV, VI PERRLA and EOMs intact bilaterally  CN V Intact sensation to sharp and light touch to the face  CN VII facial movements symmetric  CN VIII not tested  CN IX, X no uvula deviation, symmetric rise of soft palate  CN XI symmetric SCM and trapezius strength bilaterally  CN XII Midline tongue protrusion, symmetric L/R movements     Psychiatric:        Mood and Affect: Mood normal.     ED Results / Procedures / Treatments   Labs (all labs ordered are listed, but only abnormal results are displayed) Labs Reviewed  COMPREHENSIVE METABOLIC  PANEL WITH GFR - Abnormal; Notable for the following components:      Result Value   CO2 21 (*)    BUN 38 (*)    Creatinine, Ser 1.34 (*)    Calcium 8.4 (*)    Total Protein 6.2 (*)    Albumin 2.8 (*)    GFR, Estimated 55 (*)    All other components within normal limits  CBC WITH DIFFERENTIAL/PLATELET - Abnormal; Notable for the following components:   RBC 3.79 (*)    Hemoglobin 11.1 (*)    HCT 32.5 (*)    Lymphs Abs 0.6 (*)    Abs Immature Granulocytes 0.10 (*)    All other components within normal limits  URINALYSIS, ROUTINE W REFLEX MICROSCOPIC - Abnormal; Notable for the following components:   Glucose, UA 250 (*)    Hgb urine dipstick SMALL (*)    Protein, ur >300 (*)    Leukocytes,Ua SMALL (*)    All other components within normal limits  AMMONIA - Abnormal; Notable for the following components:   Ammonia 36 (*)    All other components within normal limits  URINALYSIS, MICROSCOPIC (REFLEX) - Abnormal; Notable for the following components:   Bacteria, UA RARE (*)    All other components within normal limits  RAPID URINE DRUG SCREEN, HOSP PERFORMED  ETHANOL  CBG MONITORING, ED    EKG None  Radiology CT Cervical Spine Wo Contrast Result Date: 10/24/2023 CLINICAL DATA:  78 year old male status post fall on concrete, struck head and left side. EXAM: CT CERVICAL SPINE WITHOUT CONTRAST TECHNIQUE: Multidetector CT imaging of the cervical spine was performed without intravenous contrast. Multiplanar CT image reconstructions were also generated. RADIATION DOSE REDUCTION: This exam was performed according to the departmental dose-optimization program which includes automated exposure control, adjustment of the mA and/or kV according to patient size and/or use of iterative reconstruction technique. COMPARISON:  Head CT today. FINDINGS: Alignment: Maintained cervical lordosis. Cervicothoracic junction alignment is within normal limits. Bilateral posterior element alignment is within  normal limits. Skull base and vertebrae: Bone mineralization is within normal limits for age. Largely absent dentition. Poor residual right mandible dentition. Visualized skull base is intact. No atlanto-occipital dissociation. C1 and C2 appear intact and aligned. No acute osseous abnormality identified. Soft tissues and spinal canal: No prevertebral fluid or swelling. No visible canal hematoma. Negative visible noncontrast neck soft tissues. Disc levels: Bulky cervical spine degeneration superimposed on degenerative C2-C3 facet ankylosis. Extensive upper cervical left facet arthropathy with vacuum facet. Bulky right lower cervical degenerative endplate spurring with vacuum disc. Upper chest: Negative lung apices. Visible upper thoracic levels appear intact. Negative visible noncontrast superior mediastinum. IMPRESSION: 1. No  acute traumatic injury identified in the cervical spine. 2. Advanced chronic cervical spine degeneration. Electronically Signed   By: Odessa Fleming M.D.   On: 10/24/2023 05:20   CT Head Wo Contrast Result Date: 10/24/2023 CLINICAL DATA:  78 year old male status post fall on concrete, struck head and left side. EXAM: CT HEAD WITHOUT CONTRAST TECHNIQUE: Contiguous axial images were obtained from the base of the skull through the vertex without intravenous contrast. RADIATION DOSE REDUCTION: This exam was performed according to the departmental dose-optimization program which includes automated exposure control, adjustment of the mA and/or kV according to patient size and/or use of iterative reconstruction technique. COMPARISON:  None Available. FINDINGS: Brain: Cerebral volume is within normal limits for age. No midline shift, ventriculomegaly, mass effect, evidence of mass lesion, intracranial hemorrhage or evidence of cortically based acute infarction. Gray-white differentiation within normal limits for age. Vascular: Calcified atherosclerosis at the skull base. No suspicious intracranial vascular  hyperdensity. Skull: Comminuted bilateral nasal bone fractures, including left maxilla nasal process involvement. Deviation generally to the left. Superimposed left lamina papyracea fracture but appears to be chronic, no adjacent soft tissue stranding. Calvarium appears intact.  No other acute fracture identified. Sinuses/Orbits: Well aerated paranasal sinuses, tympanic cavities and mastoids. No layering fluid or hemorrhage. Other: Large right anterior vertex exophytic soft tissue mass, roughly 3.5 cm long axis (series 4, image 84). This is nonspecific, atypical for scalp hematoma. Underlying calvarium appears intact, negative. Extensive calcified scalp vessel atherosclerosis. Other orbit and scalp soft tissues appear more normal. IMPRESSION: 1. Large and exophytic 3.5 cm right vertex scalp mass. This more resembles a tumor than a traumatic hematoma. Underlying skull intact. 2. Positive also for comminuted bilateral nasal bone fractures, including left maxillary nasal process involvement. Left lamina papyracea fracture appears to be chronic. 3.  No acute intracranial abnormality. Electronically Signed   By: Odessa Fleming M.D.   On: 10/24/2023 05:17   DG Chest Port 1 View Result Date: 10/24/2023 CLINICAL DATA:  Patient fell on concrete and struck left side of head. Cough. EXAM: PORTABLE CHEST 1 VIEW COMPARISON:  None Available. FINDINGS: The lungs are clear without focal pneumonia, edema, pneumothorax or pleural effusion. Cardiopericardial silhouette is at upper limits of normal for size. No acute bony abnormality. Telemetry leads overlie the chest. IMPRESSION: No active disease. Electronically Signed   By: Kennith Center M.D.   On: 10/24/2023 05:01    Procedures Procedures    Medications Ordered in ED Medications - No data to display  ED Course/ Medical Decision Making/ A&P                                 Medical Decision Making Amount and/or Complexity of Data Reviewed Labs: ordered. Radiology:  ordered.   This patient presents to the ED for concern of fall, this involves an extensive number of treatment options, and is a complaint that carries with it a high risk of complications and morbidity.  The differential diagnosis includes AAA, fracture, strain/pain, dislocation, ligament/tendinous injury, neurovascular blood, pneumothorax, solid organ damage, medication side effect, substance use/withdrawal, infectious etiology, metabolic encephalopathy, other   Co morbidities that complicate the patient evaluation  See HPI   Additional history obtained:  Additional history obtained from EMR External records from outside source obtained and reviewed including hospital records   Lab Tests:  I Ordered, and personally interpreted labs.  The pertinent results include: Leukocytosis.  Anemia with a hemoglobin 1.1.  Place within range.  UDS negative.  UA with rare bacteria, small leukocytes, greater than 300 protein, 250 glucose, small hemoglobin.  UDS negative.  Ethanol negative.  Mild decrease in bicarb of 21.  Improved renal function creatinine 1.34, BUN of 38 GFR 55.  No transaminitis.   Imaging Studies ordered:  I ordered imaging studies including CT head/cervical spine, chest x-ray I independently visualized and interpreted imaging which showed  CT head/cervical spine: Large and exophytic 3.5 cm right vertex scalp mass.  Comminuted bilateral nasal bone fractures including left maxillary nasal process.  No acute intracranial abnormality I agree with the radiologist interpretation   Cardiac Monitoring: / EKG:  The patient was maintained on a cardiac monitor.  I personally viewed and interpreted the cardiac monitored which showed an underlying rhythm of: Sinus rhythm   Consultations Obtained:  N/a   Problem List / ED Course / Critical interventions / Medication management  Fall Reevaluation of the patient showed that the patient stayed the same I have reviewed the patients  home medicines and have made adjustments as needed   Social Determinants of Health:  Denies tobacco, illicit drug use.   Test / Admission - Considered:  Fall Vitals signs significant for hypertension. Otherwise within normal range and stable throughout visit. Laboratory/imaging studies significant for: See above 78 year old male presents emergency department after a fall.  States that he has been seeing things more frequently over the past month or so.  Says that he was reaching for a tree branch earlier today when the tree branch was not there and he lost his balance falling.  Reports hitting his forehead on the ground.  Denies LOC, blood thinner use.  Denies any chest pain, shortness of breath, abdominal pain, pain in upper or lower extremities.  Patient also reports tingling type sensation in his feet.  States this been present for the past several months and remains unchanged.  Denies any visual disturbance, slurred speech, facial droop, weakness deficits in upper lower extremities.  Denies any new medication changes.  Denies any substance use. On exam, patient with abrasion left forehead but no other acute traumatic injury appreciated on exam.  Nonfocal neuroexam.  CT imaging of head and cervical spine concerning for nasal bone fractures as above but otherwise, no acute intracranial abnormality or other acute traumatic injury.  Patient reassured by findings.  Laboratory studies also reassuring without evidence of acute emergent process.  Regarding patient's seeing of the tree branch, incident occurred around 3 AM this morning when it was dark outside.  Patient ANO x 4 without evidence of altered mentation.  Low suspicion that he is actively hallucinating as incident was isolated when he was standing near a tree and in the dark earlier this morning.  Will recommend close follow-up with PCP in the outpatient setting for reassessment of symptoms.  Will recommend follow-up with ENT in the outpatient  setting regarding nasal bone fractures.  Treatment plan discussed with patient and he knowledge understanding was agreeable to set plan.  Patient overall well-appearing, afebrile in no acute distress. Worrisome signs and symptoms were discussed with the patient, and the patient acknowledged understanding to return to the ED if noticed. Patient was stable upon discharge.          Final Clinical Impression(s) / ED Diagnoses Final diagnoses:  Fall, initial encounter    Rx / DC Orders ED Discharge Orders     None         Peter Garter, [place] 10/24/23 [phone]  Loetta Rough, MD 10/24/23 [phone]
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
